# Patient Record
Sex: Female | Born: 1965 | Race: White | Hispanic: No | Marital: Married | Smoking: Never smoker
Health system: Southern US, Community
[De-identification: ages and names within clinical notes are randomized; demographics above are authoritative.]

## PROBLEM LIST (undated history)

## (undated) DIAGNOSIS — G43909 Migraine, unspecified, not intractable, without status migrainosus: Secondary | ICD-10-CM

## (undated) DIAGNOSIS — E7800 Pure hypercholesterolemia, unspecified: Secondary | ICD-10-CM

## (undated) DIAGNOSIS — G4710 Hypersomnia, unspecified: Secondary | ICD-10-CM

## (undated) HISTORY — DX: Migraine, unspecified, not intractable, without status migrainosus: G43.909

## (undated) HISTORY — DX: Pure hypercholesterolemia, unspecified: E78.00

## (undated) HISTORY — DX: Hypersomnia, unspecified: G47.10

---

## 1998-11-11 ENCOUNTER — Other Ambulatory Visit: Admission: RE | Admit: 1998-11-11 | Discharge: 1998-11-11 | Payer: Self-pay | Admitting: Obstetrics and Gynecology

## 1999-12-04 ENCOUNTER — Other Ambulatory Visit: Admission: RE | Admit: 1999-12-04 | Discharge: 1999-12-04 | Payer: Self-pay | Admitting: Obstetrics and Gynecology

## 2001-03-14 ENCOUNTER — Other Ambulatory Visit: Admission: RE | Admit: 2001-03-14 | Discharge: 2001-03-14 | Payer: Self-pay | Admitting: Internal Medicine

## 2002-03-14 ENCOUNTER — Other Ambulatory Visit: Admission: RE | Admit: 2002-03-14 | Discharge: 2002-03-14 | Payer: Self-pay | Admitting: Obstetrics and Gynecology

## 2003-05-08 ENCOUNTER — Other Ambulatory Visit: Admission: RE | Admit: 2003-05-08 | Discharge: 2003-05-08 | Payer: Self-pay | Admitting: Obstetrics and Gynecology

## 2004-06-10 ENCOUNTER — Other Ambulatory Visit: Admission: RE | Admit: 2004-06-10 | Discharge: 2004-06-10 | Payer: Self-pay | Admitting: Obstetrics and Gynecology

## 2005-06-22 ENCOUNTER — Other Ambulatory Visit: Admission: RE | Admit: 2005-06-22 | Discharge: 2005-06-22 | Payer: Self-pay | Admitting: Obstetrics and Gynecology

## 2010-07-08 ENCOUNTER — Emergency Department (HOSPITAL_BASED_OUTPATIENT_CLINIC_OR_DEPARTMENT_OTHER): Admission: EM | Admit: 2010-07-08 | Discharge: 2010-07-08 | Payer: Self-pay | Admitting: Emergency Medicine

## 2010-07-08 ENCOUNTER — Ambulatory Visit: Payer: Self-pay | Admitting: Diagnostic Radiology

## 2010-07-29 ENCOUNTER — Ambulatory Visit (HOSPITAL_BASED_OUTPATIENT_CLINIC_OR_DEPARTMENT_OTHER): Admission: RE | Admit: 2010-07-29 | Discharge: 2010-07-29 | Payer: Self-pay | Admitting: Family Medicine

## 2010-07-29 ENCOUNTER — Ambulatory Visit: Payer: Self-pay | Admitting: Diagnostic Radiology

## 2011-01-29 LAB — BASIC METABOLIC PANEL
CO2: 26 mEq/L (ref 19–32)
Creatinine, Ser: 0.7 mg/dL (ref 0.4–1.2)
GFR calc Af Amer: >60 mL/min (ref >60)
Glucose, Bld: 103 mg/dL — ABNORMAL HIGH (ref 70–99)
Potassium: 4 mEq/L (ref 3.5–5.1)
Sodium: 141 mEq/L (ref 135–145)

## 2011-01-29 LAB — CBC
HCT: 38.7 % (ref 36.0–46.0)
Hemoglobin: 13.4 g/dL (ref 12.0–15.0)
RBC: 4.19 MIL/uL (ref 3.87–5.11)

## 2011-01-29 LAB — DIFFERENTIAL
Basophils Absolute: 0.1 10*3/uL (ref 0.0–0.1)
Basophils Relative: 1 % (ref 0–1)
Eosinophils Absolute: 0.1 10*3/uL (ref 0.0–0.7)
Lymphocytes Relative: 16 % (ref 12–46)
Lymphs Abs: 1.3 10*3/uL (ref 0.7–4.0)
Monocytes Relative: 5 % (ref 3–12)
Neutro Abs: 6.3 10*3/uL (ref 1.7–7.7)

## 2011-01-29 LAB — POCT CARDIAC MARKERS
CKMB, poc: <1 ng/mL — ABNORMAL LOW (ref 1.0–8.0)
Myoglobin, poc: 24 ng/mL (ref 12–200)
Troponin i, poc: <0.05 ng/mL (ref 0.00–0.09)

## 2014-04-26 ENCOUNTER — Encounter: Payer: Self-pay | Admitting: Neurology

## 2014-04-26 ENCOUNTER — Ambulatory Visit (INDEPENDENT_AMBULATORY_CARE_PROVIDER_SITE_OTHER): Payer: BC Managed Care – PPO | Admitting: Neurology

## 2014-04-26 ENCOUNTER — Encounter (INDEPENDENT_AMBULATORY_CARE_PROVIDER_SITE_OTHER): Payer: Self-pay

## 2014-04-26 VITALS — BP 107/69 | HR 88 | Resp 16 | Ht 64.0 in | Wt 128.0 lb

## 2014-04-26 DIAGNOSIS — G4710 Hypersomnia, unspecified: Secondary | ICD-10-CM | POA: Insufficient documentation

## 2014-04-26 HISTORY — DX: Hypersomnia, unspecified: G47.10

## 2014-04-26 NOTE — Patient Instructions (Signed)
Narcolepsy Narcolepsy is a disabling neurological disorder of sleep regulation. It affects the control of sleep. It also affects the control of wakefulness. It is an interruption of the dreaming state of sleep. This state is known as REM or rapid eye movement sleep.  SYMPTOMS  The development, number, and severity of symptoms vary widely among people with the disorder. Symptoms generally begin between the ages of 15 and 30. The four classic symptoms of the disorder are:   Excessive daytime sleepiness.  Cataplexy. This is sudden, brief episodes of muscle weakness or paralysis. It is caused by strong emotions. Common strong emotions are laughter, anger, surprise, or anticipation.  Sleep paralysis. This is paralysis upon falling asleep or waking up.  Hallucinations. These are vivid dream-like images that occur at when you first fall asleep. Other symptoms include:   Unrelenting excessive sleepiness. This is usually the first and most obvious symptom.  Sleep attacks. Patients have strong sleep attacks throughout the day. These attacks can last for 30 seconds to more than 30 minutes. These happen no matter how much or how well the person slept the night before. These attacks end up making the person sleep at work and social events. The person can fall asleep while eating, talking, and driving. They also fall asleep at other out of place times.  Disturbed nighttime sleep.  Tossing and turning in bed.  Leg jerks.  Nightmares.  Waking up often. DIAGNOSIS  It's possible that genetics play a role in this disorder. Narcolepsy is not a rare disorder. It is often misdiagnosed. It is often diagnosed years after symptoms first appear. Early diagnosis and treatment are important. This help the physical and mental well-being of the patient. TREATMENT  There is no cure for narcolepsy. The symptoms can be controlled with behavioral and medical therapy. The excessive daytime sleepiness may be treated with  stimulant drugs. It may also be treated with the drug modafinil (Provigil). Cataplexy and other REM-sleep symptoms may be treated with antidepressant medications. Medications will reduce the symptoms. Medications will not ease symptoms entirely. Many available medications have side effects. Basic lifestyle changes may also reduce the symptoms. These changes include having regular sleep schedules and scheduled daytime naps. Other lifestyle changes include avoiding "over-stimulating" situations. Document Released: 10/22/2002 Document Revised: 01/24/2012 Document Reviewed: 11/01/2005 ExitCare Patient Information 2014 [employer].  

## 2014-04-26 NOTE — Progress Notes (Signed)
[facility]  Provider:  Melvyn Novasarmen  Mischa Brittingham, M D  Referring Provider: Levi Carroll, Mark E, MD Primary Care Physician:  Jill LabellaMILLER,LISA LYNN, MD  Chief Complaint  Patient presents with  . New Evaluation    Room 11  . Sleep consult    HPI:  Jill Carroll is a 48 y.o. female  Is seen here as a referral  from Dr. Dareen Carroll and Dr Jill Carroll ,   Jill Carroll has a lifelong history of sleepiness. She recently fell asleep during a difficult conversation with a friend , who is going through a life altering crisis. Her friend knows her for 25 years and didn't take it seriously/ personally. She has dozed off in the classroom frequently , teaching 8 th grade and during a recent visit to the cinema she fell asleep 4 times  (during a emotionally charged movie). Her son unched her gently to stay awake and finally woke her up at the end of the movie. The patient also remembers that when she was in school as a pupil she was more fatigued and often less energetic than mother for example. Detailed questions about cataplexy have not revealed any to be present. When driving she will have the radio loud and the window down to help her not falling asleep. She chews gum.  She endorsed the Epworth Sleepiness Scale at 23 point she endorses fatigue question the 55 points does not feel clinically depressed.  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 During the school day she fights irresistible urge to fall asleep and often seems to lose that for. Prior about 4:30 to returning home, she is extremely fatigued. She falls asleep before 7 Pm often  While reading and correcting papers, she takes 15 minutes of a power nap and is ready to go for another 3 hours.  Every evening she sleeps before she transfers to the bedroom.  Bedtime is between 9 and 10 , and than she has some trouble to sleep-  She has 2 bathroom breaks, wakes up 2-3 times, she has vivid dreams. She does not recall dreams  during naps.  She has had isolated sleep paralysis and dreams that she falls, sleep myoclonus.  She shares the bedroom with her husband, it is a cool, quiet and dark environment.  She typically wakes up before the alarm , 5.10 AM - no trouble to start the day. She wishes she could sleep another hour. No headaches, no dry mouth.     Review of Systems: Out of a complete 14 system review, the patient complains of only the following symptoms, and all other reviewed systems are negative. She endorsed the Epworth Sleepiness Scale at 23 point , she endorses fatigue question at 55 points , does not feel clinically depressed.   History   Social History  . Marital Status: Married    Spouse Name: Tad    Number of Children: 2  . Years of Education: BS   Occupational History  . Not on file.   Social History Main Topics  . Smoking status: Never Smoker   . Smokeless tobacco: Never Used  . Alcohol Use: Yes     Comment: rarely  . Drug Use: No  . Sexual Activity: Not on file   Other Topics Concern  . Not on file   Social History Narrative   Patient is married (Tad) and lives at home with her husband and children.   Patient has two children.  Patient works full-time, Runner, broadcasting/film/videoteacher at [employer].   Patient has a Bachelor's degree.   Patient is left-handed.   Patient drinks 16-24 oz of iced tea daily.    Family History  Problem Relation Age of Onset  . Breast cancer Maternal Aunt   . Leukemia Maternal Grandmother   . Aneurysm Maternal Grandfather   . Hypertension Mother   . Hypertension Father     Past Medical History  Diagnosis Date  . Migraine   . High cholesterol   . Hypersomnia, persistent   . Hypersomnia, persistent 04/26/2014    History reviewed. No pertinent past surgical history.  Current Outpatient Prescriptions  Medication Sig Dispense Refill  . NORTREL 7/7/7 0.5/0.75/1-35 MG-MCG tablet 1 tablet daily.       No current facility-administered medications  for this visit.    Allergies as of 04/26/2014  . (Not on File)    Vitals: BP 107/69  Pulse 88  Resp 16  Ht 5\' 4"  (1.626 m)  Wt 128 lb (58.06 kg)  BMI 21.96 kg/m2  LMP 03/15/2014 Last Weight:  Wt Readings from Last 1 Encounters:  04/26/14 128 lb (58.06 kg)   Last Height:   Ht Readings from Last 1 Encounters:  04/26/14 5\' 4"  (1.626 m)   BMI  .  Physical exam:  General: The patient is awake, alert and appears not in acute distress. The patient is well groomed. Head: Normocephalic, atraumatic. Neck is supple. Mallampati 2 , neck circumference: 14 inches. Cardiovascular:  Regular rate and rhythm , without  murmurs or carotid bruit, and without distended neck veins. Respiratory: Lungs are clear to auscultation. Skin:  Without evidence of edema, or rash Trunk: BMI is normal.  Neurologic exam : The patient is awake and alert, oriented to place and time.  Memory subjective described as intact.  There is a normal attention span & concentration ability. Speech is fluent without dysarthria, dysphonia or aphasia. Mood and affect are appropriate.  Cranial nerves: Pupils are equal and briskly reactive to light. Funduscopic exam without evidence of pallor or edema. Extraocular movements  in vertical and horizontal planes intact and without nystagmus. Visual fields by finger perimetry are intact. Hearing to finger rub intact.  Facial sensation intact to fine touch. Facial motor strength is symmetric and tongue and uvula move midline.  Motor exam:  Normal tone and normal muscle bulk and symmetric normal strength in all extremities.  Sensory:  Fine touch, pinprick and vibration were tested in all extremities. Proprioception is  normal.  Coordination: Rapid alternating movements in the fingers/hands is tested and normal. Finger-to-nose maneuver tested and normal without evidence of ataxia, dysmetria or tremor.  Gait and station: Patient walks without assistive device .  Deep tendon  reflexes: in the  upper and lower extremities are symmetric and intact. Babinski maneuver downgoing.   Assessment:  After physical and neurologic examination, review of laboratory studies, imaging, neurophysiology testing and pre-existing records, assessment is   1) Excessive daytime sleepiness. No evidence of cataplexy . 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  Plan:  Treatment plan and additional workup : PSG and MSLT, no REM suppressant medication.

## 2014-06-11 ENCOUNTER — Ambulatory Visit (INDEPENDENT_AMBULATORY_CARE_PROVIDER_SITE_OTHER): Payer: BC Managed Care – PPO | Admitting: Neurology

## 2014-06-11 VITALS — BP 126/80 | HR 52 | Ht 64.0 in | Wt 128.0 lb

## 2014-06-11 DIAGNOSIS — G4710 Hypersomnia, unspecified: Secondary | ICD-10-CM

## 2014-06-12 DIAGNOSIS — G4710 Hypersomnia, unspecified: Secondary | ICD-10-CM

## 2014-07-03 ENCOUNTER — Telehealth: Payer: Self-pay | Admitting: Neurology

## 2014-07-03 ENCOUNTER — Encounter: Payer: Self-pay | Admitting: *Deleted

## 2014-07-03 NOTE — Telephone Encounter (Signed)
I called and and left a message for the patient about her recent sleep study. I informed the patient that both sleep studies were normal. I will fax a copy of both reports to Dr. Loraine LericheMark Anderson's office and mail a copy to the patient.

## 2014-07-15 ENCOUNTER — Ambulatory Visit (INDEPENDENT_AMBULATORY_CARE_PROVIDER_SITE_OTHER): Payer: BC Managed Care – PPO | Admitting: Neurology

## 2014-07-15 ENCOUNTER — Encounter: Payer: Self-pay | Admitting: Neurology

## 2014-07-15 VITALS — BP 107/72 | HR 66 | Ht 64.0 in | Wt 128.0 lb

## 2014-07-15 DIAGNOSIS — G4710 Hypersomnia, unspecified: Secondary | ICD-10-CM

## 2014-07-15 MED ORDER — ARMODAFINIL 250 MG PO TABS: 250.0000 mg | ORAL_TABLET | Freq: Every day | ORAL | Status: DC

## 2014-07-15 MED ORDER — ARMODAFINIL 250 MG PO TABS: 250.0000 mg | ORAL_TABLET | Freq: Every day | ORAL | Status: AC

## 2014-07-15 NOTE — Patient Instructions (Signed)
Report to [place] if your modafinil works well during the work day. Armodafinil tablets What is this medicine? ARMODAFINIL (ar moe DAF i nil) is used to treat excessive sleepiness caused by certain sleep disorders. This includes narcolepsy, sleep apnea, and shift work sleep disorder. This medicine may be used for other purposes; ask your health care provider or pharmacist if you have questions. COMMON BRAND NAME(S): Nuvigil What should I tell my health care provider before I take this medicine? They need to know if you have any of these conditions: -kidney disease -liver disease -an unusual or allergic reaction to armodafinil, modafinil, medicines, foods, dyes, or preservatives -pregnant or trying to get pregnant -breast-feeding How should I use this medicine? Take this medicine by mouth with a glass of water. Follow the directions on the prescription label. Take your doses at regular intervals. Do not take your medicine more often than directed. Do not stop taking except on your doctor's advice. A special MedGuide will be given to you by the pharmacist with each prescription and refill. Be sure to read this information carefully each time. Talk to your pediatrician regarding the use of this medicine in children. While this drug may be prescribed for children as young as 30 years of age for selected conditions, precautions do apply. Overdosage: If you think you have taken too much of this medicine contact a poison control center or emergency room at once. NOTE: This medicine is only for you. Do not share this medicine with others. What if I miss a dose? If you miss a dose, take it as soon as you can. If it is almost time for your next dose, take only that dose. Do not take double or extra doses. What may interact with this medicine? Do not take this medicine with any of the following medications: -amphetamine or dextroamphetamine -dexmethylphenidate or methylphenidate -MAOIs like Carbex, Eldepryl,  Marplan, Nardil, and Parnate -pemoline -procarbazine This medicine may also interact with the following medications: -antifungal medicines like itraconazole or ketoconazole -barbiturates, like phenobarbital -birth control pills or other hormone-containing birth control devices or implants -carbamazepine -cyclosporine -diazepam -medicines for depression, anxiety, or psychotic disturbances -phenytoin -propranolol -triazolam -warfarin This list may not describe all possible interactions. Give your health care provider a list of all the medicines, herbs, non-prescription drugs, or dietary supplements you use. Also tell them if you smoke, drink alcohol, or use illegal drugs. Some items may interact with your medicine. What should I watch for while using this medicine? Visit your doctor or health care professional for regular checks on your progress. The full effect of this medicine may not be seen right away. This medicine may affect your concentration, function, or may hide signs that you are tired. You may get dizzy. Do not drive, use machinery, or do anything that needs mental alertness until you know how this drug affects you. Alcohol can make you more dizzy and may interfere with your response to this medicine or your alertness. Avoid alcoholic drinks. Birth control pills may not work properly while you are taking this medicine. Talk to your doctor about using an extra method of birth control. It is unknown if the effects of this medicine will be increased by the use of caffeine. Caffeine is found in many foods, beverages, and medications. Ask your doctor if you should limit or change your intake of caffeine-containing products while on this medicine. What side effects may I notice from receiving this medicine? Side effects that you should report to your doctor  or health care professional as soon as possible: -allergic reactions like skin rash, itching or hives, swelling of the face, lips, or  tongue -breathing problems -chest pain -fast, irregular heartbeat -increased blood pressure, particularly if you have high blood pressure -mental problems -sore throat, fever, or chills -tremors -vomiting Side effects that usually do not require medical attention (report to your doctor or health care professional if they continue or are bothersome): -headache -nausea, diarrhea, or stomach upset -nervousness -trouble sleeping This list may not describe all possible side effects. Call your doctor for medical advice about side effects. You may report side effects to FDA at 1-800-FDA-1088. Where should I keep my medicine? Keep out of the reach of children. Store at room temperature between 20 and 25 degrees C (68 and 77 degrees F). Throw away any unused medicine after the expiration date. NOTE: This sheet is a summary. It may not cover all possible information. If you have questions about this medicine, talk to your doctor, pharmacist, or health care provider.  2015, Elsevier/Gold Standard. (2009-10-14 21:10:28)

## 2014-07-15 NOTE — Progress Notes (Signed)
[facility]  Provider:  Larey Seat, M D  Referring Provider: Kathyrn Lass, MD Primary Care Physician:  Jill Solo, MD  Chief Complaint  Patient presents with  . Follow-up    Room 10  . Results    HPI:  Jill Carroll is a 48 y.o. female  Is seen here as a referral  from Dr. Sabra Carroll and Dr Sabra Carroll ,   Jill Carroll has a lifelong history of sleepiness. She recently fell asleep during a difficult conversation with a friend , who is going through a life altering crisis. Her friend knows her for 25 years and didn't take it seriously/ personally. She has dozed off in the classroom frequently , teaching 8 th grade and during a recent visit to the cinema she fell asleep 4 times  (during a emotionally charged movie). Her son unched her gently to stay awake and finally woke her up at the end of the movie. The patient also remembers that when she was in school as a pupil she was more fatigued and often less energetic than mother for example. Detailed questions about cataplexy have not revealed any to be present. When driving she will have the radio loud and the window down to help her not falling asleep. She chews gum.  She endorsed the Epworth Sleepiness Scale at 23 point she endorses fatigue question the 55 points does not feel clinically depressed.  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 During the school day she fights irresistible urge to fall asleep and often seems to lose that for. Prior about 4:30 to returning home, she is extremely fatigued. She falls asleep before 7 Pm often  While reading and correcting papers, she takes 15 minutes of a power nap and is ready to go for another 3 hours.  Every evening she sleeps before she transfers to the bedroom.  Bedtime is between 9 and 10 , and than she has some trouble to sleep-  She has 2 bathroom breaks, wakes up 2-3 times, she has vivid dreams. She does not recall dreams during naps.  She  has had isolated sleep paralysis and dreams that she falls, sleep myoclonus.  She shares the bedroom with her husband, it is a cool, quiet and dark environment.  She typically wakes up before the alarm , 5.10 AM - no trouble to start the day. She wishes she could sleep another hour. No headaches, no dry mouth.     Review of Systems: Out of a complete 14 system review, the patient complains of only the following symptoms, and all other reviewed systems are negative. She endorsed the Epworth Sleepiness Scale at 23 point , she endorses fatigue question at 55 points , does not feel clinically depressed.   History   Social History  . Marital Status: Married    Spouse Name: Jill Carroll    Number of Children: 2  . Years of Education: BS   Occupational History  . Not on file.   Social History Main Topics  . Smoking status: Never Smoker   . Smokeless tobacco: Never Used  . Alcohol Use: Yes     Comment: rarely  . Drug Use: No  . Sexual Activity: Not on file   Other Topics Concern  . Not on file   Social History Narrative   Patient is married (Jill Carroll) and lives at home with her husband and children.   Patient has two children.   Patient works full-time,  teacher at [employer].   Patient has a Bachelor's degree.   Patient is left-handed.   Patient drinks 16-24 oz of iced tea daily.    Family History  Problem Relation Age of Onset  . Breast cancer Maternal Aunt   . Leukemia Maternal Grandmother   . Aneurysm Maternal Grandfather   . Hypertension Mother   . Hypertension Father     Past Medical History  Diagnosis Date  . Migraine   . High cholesterol   . Hypersomnia, persistent   . Hypersomnia, persistent 04/26/2014    History reviewed. No pertinent past surgical history.  Current Outpatient Prescriptions  Medication Sig Dispense Refill  . NORTREL 7/7/7 0.5/0.75/1-35 MG-MCG tablet 1 tablet daily.       No current facility-administered medications for this visit.     Allergies as of 07/15/2014  . (Not on File)    Vitals: BP 107/72  Pulse 66  Ht _0  (1.626 m)  Wt 128 lb (58.06 kg)  BMI 21.96 kg/m2  LMP 07/08/2014 Last Weight:  Wt Readings from Last 1 Encounters:  07/15/14 128 lb (58.06 kg)   Last Height:   Ht Readings from Last 1 Encounters:  07/15/14 _1  (1.626 m)   BMI  .  Physical exam:  General: The patient is awake, alert and appears not in acute distress. The patient is well groomed. Head: Normocephalic, atraumatic. Neck is supple. Mallampati 2 , neck circumference: 14 inches. Cardiovascular:  Regular rate and rhythm , without  murmurs or carotid bruit, and without distended neck veins. Respiratory: Lungs are clear to auscultation. Skin:  Without evidence of edema, or rash Trunk: BMI is normal.  Neurologic exam : The patient is awake and alert, oriented to place and time.  Memory subjective described as intact.  There is a normal attention span & concentration ability. Speech is fluent without dysarthria, dysphonia or aphasia. Mood and affect are appropriate.  Cranial nerves: Pupils are equal and briskly reactive to light. Funduscopic exam without evidence of pallor or edema. Extraocular movements  in vertical and horizontal planes intact and without nystagmus. Visual fields by finger perimetry are intact. Hearing to finger rub intact.  Facial sensation intact to fine touch. Facial motor strength is symmetric and tongue and uvula move midline.  Motor exam:  Normal tone and normal muscle bulk and symmetric normal strength in all extremities.  Sensory:  Fine touch, pinprick and vibration were tested in all extremities. Proprioception is  normal.  Coordination: Rapid alternating movements in the fingers/hands is tested and normal. Finger-to-nose maneuver tested and normal without evidence of ataxia, dysmetria or tremor.  Gait and station: Patient walks without assistive device .  Deep tendon reflexes: in the  upper and  lower extremities are symmetric and intact. Babinski maneuver downgoing.   Assessment:  After physical and neurologic examination, review of laboratory studies, imaging, neurophysiology testing and pre-existing records, assessment is   1) Excessive daytime sleepiness. No evidence of cataplexy . Continued a sleep attacks.  Irresistible urge to go to sleep.      MSLT was negative for SREM , normal PSG dated 06-11-14. Patient falls asleep with the TV on .      Without TV her mind races.   Plan:  Treatment plan and additional workup :I will add an HLA narcolepsy test.  Add a stimulant in daytime or SSRI to reduce ruminating thoughts at night , discussion for 20 minutes.  Gave NUVIGIL samples.

## 2014-07-26 ENCOUNTER — Telehealth: Payer: Self-pay | Admitting: Neurology

## 2014-07-26 NOTE — Telephone Encounter (Signed)
Left message that blood work results for HLA typing for narcolepsy came back negative.

## 2014-08-21 ENCOUNTER — Other Ambulatory Visit: Payer: Self-pay | Admitting: Obstetrics and Gynecology

## 2014-08-23 LAB — CYTOLOGY - PAP

## 2014-11-13 ENCOUNTER — Ambulatory Visit: Payer: BC Managed Care – PPO | Admitting: Neurology

## 2021-08-13 ENCOUNTER — Other Ambulatory Visit: Payer: Self-pay | Admitting: Obstetrics and Gynecology

## 2021-08-13 DIAGNOSIS — R928 Other abnormal and inconclusive findings on diagnostic imaging of breast: Secondary | ICD-10-CM

## 2021-08-14 ENCOUNTER — Ambulatory Visit
Admission: RE | Admit: 2021-08-14 | Discharge: 2021-08-14 | Disposition: A | Discharge status: Home / Self Care | Payer: BC Managed Care – PPO | Source: Ambulatory Visit | Attending: Obstetrics and Gynecology | Admitting: Obstetrics and Gynecology

## 2021-08-14 ENCOUNTER — Other Ambulatory Visit: Payer: Self-pay

## 2021-08-14 ENCOUNTER — Other Ambulatory Visit: Payer: Self-pay | Admitting: Obstetrics and Gynecology

## 2021-08-14 DIAGNOSIS — R928 Other abnormal and inconclusive findings on diagnostic imaging of breast: Secondary | ICD-10-CM

## 2021-08-14 DIAGNOSIS — R921 Mammographic calcification found on diagnostic imaging of breast: Secondary | ICD-10-CM

## 2022-02-12 ENCOUNTER — Ambulatory Visit
Admission: RE | Admit: 2022-02-12 | Discharge: 2022-02-12 | Disposition: A | Discharge status: Home / Self Care | Payer: BC Managed Care – PPO | Source: Ambulatory Visit | Attending: Obstetrics and Gynecology | Admitting: Obstetrics and Gynecology

## 2022-02-12 ENCOUNTER — Other Ambulatory Visit: Payer: Self-pay | Admitting: Obstetrics and Gynecology

## 2022-02-12 DIAGNOSIS — R921 Mammographic calcification found on diagnostic imaging of breast: Secondary | ICD-10-CM

## 2022-07-31 IMAGING — MG MM DIGITAL DIAGNOSTIC UNILAT*R* W/ TOMO W/ CAD
6 series · 6 of 14 positions shown · non-contrast
Comparison: Previous exam(s).

CLINICAL DATA: 56-year-old female for six-month follow-up of UPPER
RIGHT breast calcifications.

EXAM:
DIGITAL DIAGNOSTIC UNILATERAL RIGHT MAMMOGRAM WITH TOMOSYNTHESIS AND
CAD
TECHNIQUE: Right digital diagnostic mammography and breast tomosynthesis was
performed. The images were evaluated with computer-aided detection.

[R ML]
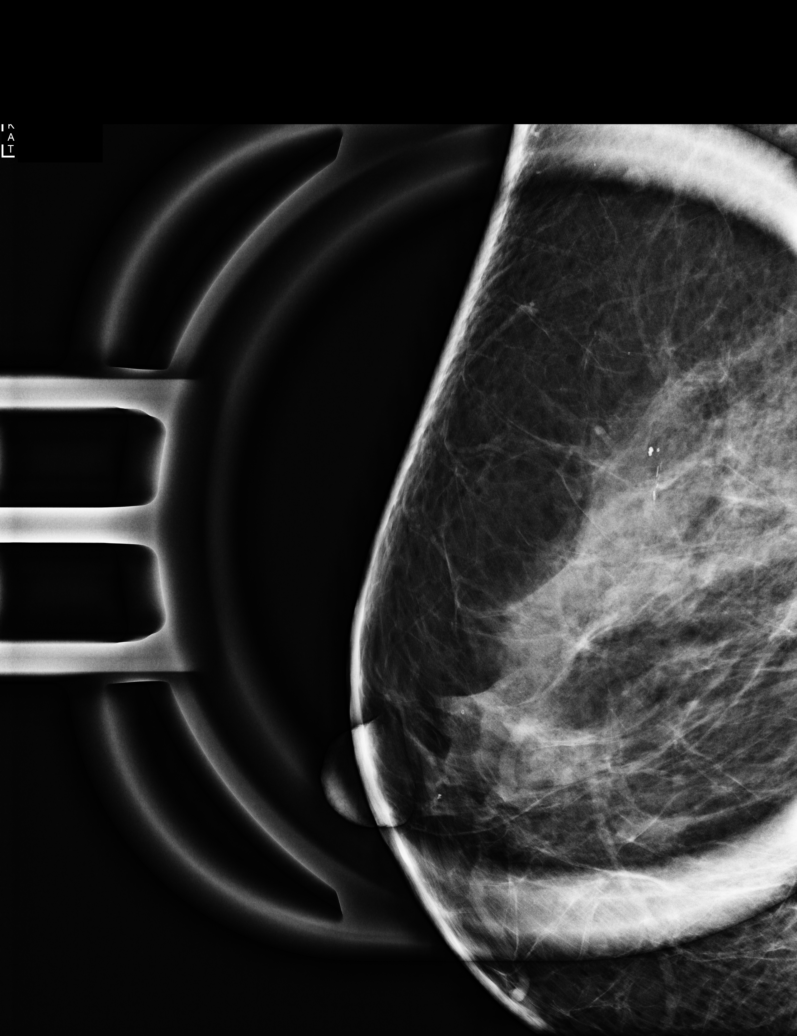

[R CC]
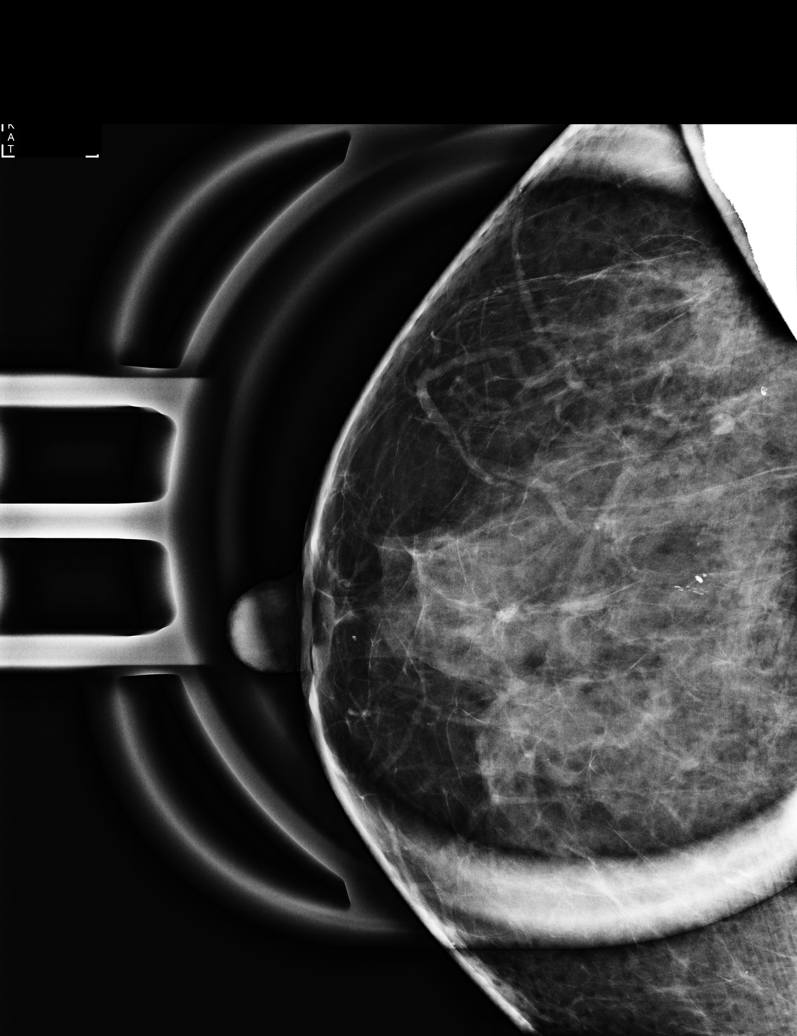

[R MLO synth-2D]
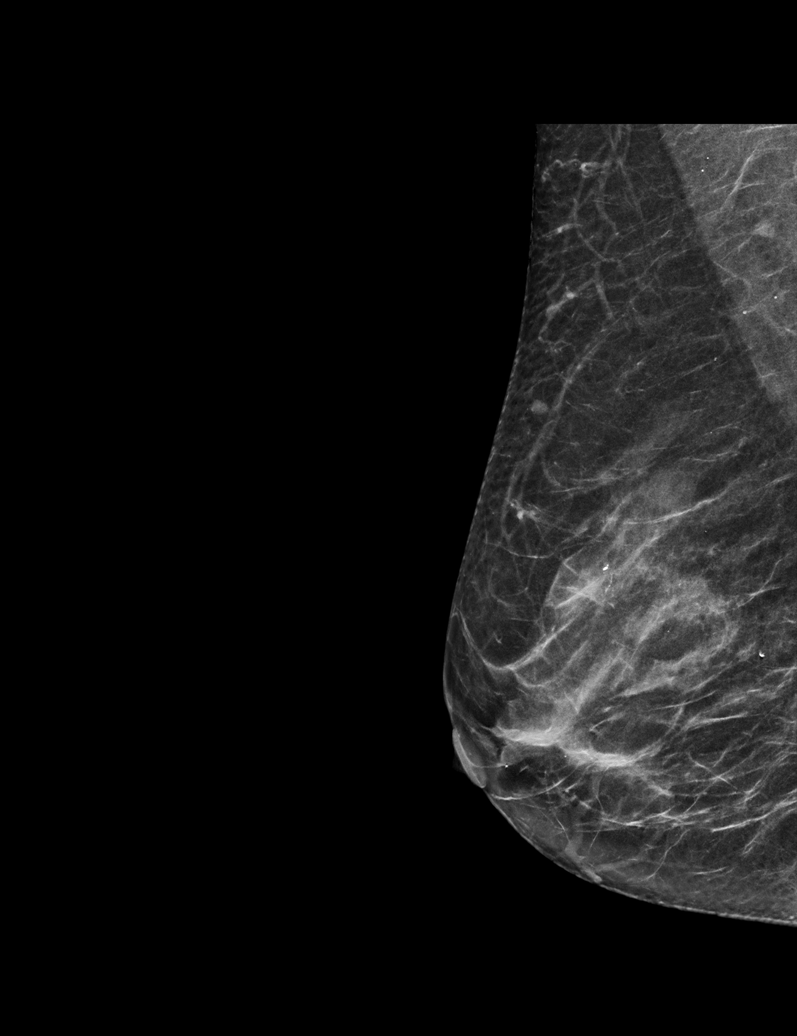

[R CC synth-2D]
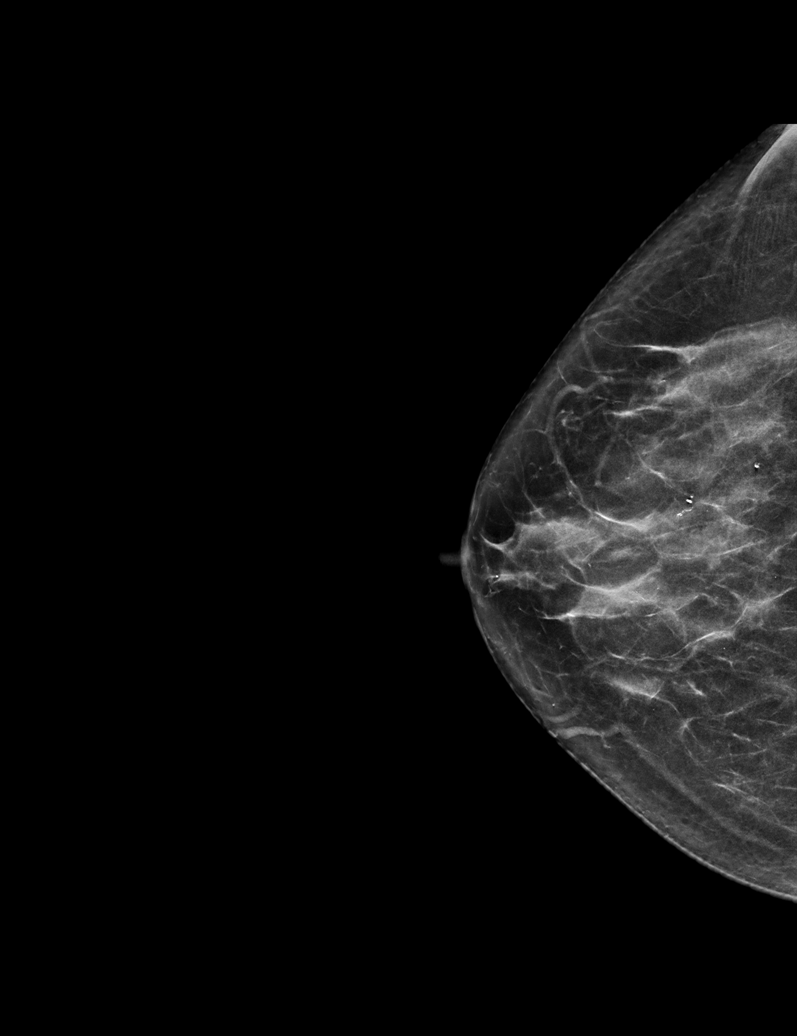

[R MLO tomo · tomo slice 29/56.0]
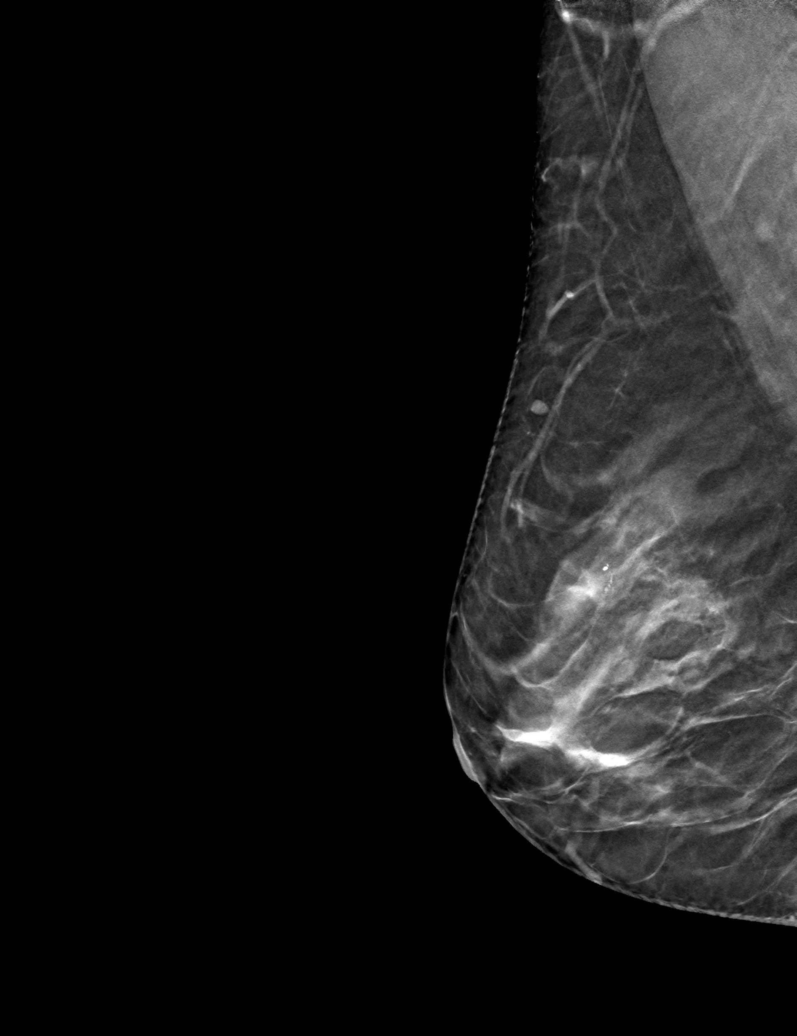

[R CC tomo · tomo slice 29/58.0]
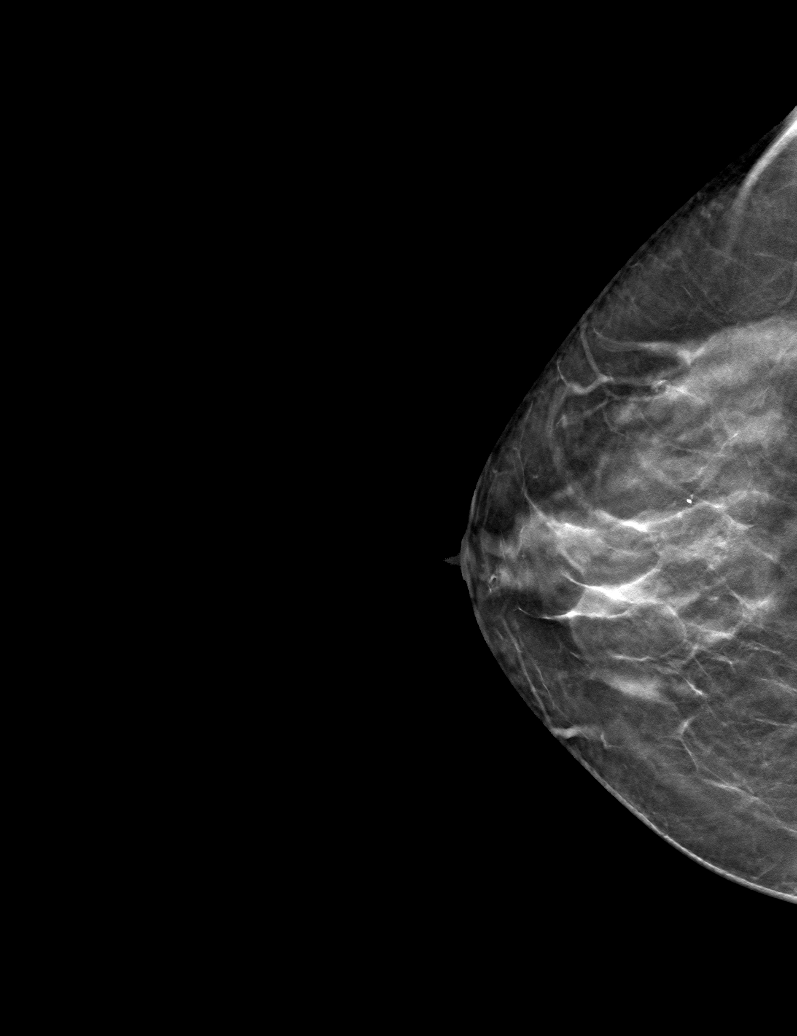

[6 of 14 positions shown; findings below may reference images not displayed]

ACR Breast Density Category c: The breast tissue is heterogeneously
dense, which may obscure small masses.
FINDINGS: Full field and magnification views of the RIGHT breast demonstrate a
stable 0.7 cm group of primarily coarse UPPER RIGHT breast
calcifications.

No new mammographic findings are noted within the RIGHT breast.
IMPRESSION: Stable likely benign UPPER RIGHT breast calcifications. Six-month
follow-up recommended to ensure 1 year stability.

RECOMMENDATION:
Bilateral diagnostic mammogram with magnification views of the RIGHT
breast in 6 months.

I have discussed the findings and recommendations with the patient.
If applicable, a reminder letter will be sent to the patient
regarding the next appointment.

BI-RADS CATEGORY  3: Probably benign.

## 2022-09-15 ENCOUNTER — Ambulatory Visit
Admission: RE | Admit: 2022-09-15 | Discharge: 2022-09-15 | Disposition: A | Discharge status: Home / Self Care | Payer: BC Managed Care – PPO | Source: Ambulatory Visit | Attending: Obstetrics and Gynecology | Admitting: Obstetrics and Gynecology

## 2022-09-15 DIAGNOSIS — R921 Mammographic calcification found on diagnostic imaging of breast: Secondary | ICD-10-CM

## 2022-09-17 ENCOUNTER — Other Ambulatory Visit: Payer: Self-pay | Admitting: Obstetrics and Gynecology

## 2022-09-17 DIAGNOSIS — R921 Mammographic calcification found on diagnostic imaging of breast: Secondary | ICD-10-CM

## 2022-09-23 ENCOUNTER — Inpatient Hospital Stay: Admission: RE | Admit: 2022-09-23 | Payer: BC Managed Care – PPO | Source: Ambulatory Visit

## 2022-09-28 ENCOUNTER — Ambulatory Visit
Admission: RE | Admit: 2022-09-28 | Discharge: 2022-09-28 | Disposition: A | Discharge status: Home / Self Care | Payer: BC Managed Care – PPO | Source: Ambulatory Visit | Attending: Obstetrics and Gynecology | Admitting: Obstetrics and Gynecology

## 2022-09-28 DIAGNOSIS — R921 Mammographic calcification found on diagnostic imaging of breast: Secondary | ICD-10-CM

## 2022-09-28 HISTORY — PX: BREAST BIOPSY: SHX20
